# Patient Record
Sex: Male | Born: 1975
Health system: Southern US, Community
[De-identification: ages and names within clinical notes are randomized; demographics above are authoritative.]

## PROBLEM LIST (undated history)

## (undated) DIAGNOSIS — C9100 Acute lymphoblastic leukemia not having achieved remission: Secondary | ICD-10-CM

## (undated) DIAGNOSIS — K859 Acute pancreatitis without necrosis or infection, unspecified: Secondary | ICD-10-CM

## (undated) HISTORY — PX: CHEST SURGERY: SHX595

---

## 1997-05-24 ENCOUNTER — Inpatient Hospital Stay (HOSPITAL_COMMUNITY): Admission: AD | Admit: 1997-05-24 | Discharge: 1997-05-24 | Payer: Self-pay | Admitting: *Deleted

## 1999-10-02 ENCOUNTER — Emergency Department (HOSPITAL_COMMUNITY): Admission: EM | Admit: 1999-10-02 | Discharge: 1999-10-02 | Payer: Self-pay | Admitting: Emergency Medicine

## 2000-06-29 ENCOUNTER — Emergency Department (HOSPITAL_COMMUNITY): Admission: EM | Admit: 2000-06-29 | Discharge: 2000-06-29 | Payer: Self-pay | Admitting: Emergency Medicine

## 2000-06-29 ENCOUNTER — Encounter: Payer: Self-pay | Admitting: Emergency Medicine

## 2000-11-12 ENCOUNTER — Emergency Department (HOSPITAL_COMMUNITY): Admission: EM | Admit: 2000-11-12 | Discharge: 2000-11-12 | Payer: Self-pay | Admitting: Emergency Medicine

## 2002-07-08 ENCOUNTER — Encounter: Payer: Self-pay | Admitting: Emergency Medicine

## 2002-07-08 ENCOUNTER — Inpatient Hospital Stay (HOSPITAL_COMMUNITY): Admission: EM | Admit: 2002-07-08 | Discharge: 2002-07-10 | Payer: Self-pay | Admitting: Emergency Medicine

## 2009-06-01 ENCOUNTER — Emergency Department (HOSPITAL_COMMUNITY): Admission: EM | Admit: 2009-06-01 | Discharge: 2009-06-01 | Payer: Self-pay | Admitting: Emergency Medicine

## 2009-06-04 ENCOUNTER — Inpatient Hospital Stay (HOSPITAL_COMMUNITY): Admission: EM | Admit: 2009-06-04 | Discharge: 2009-06-05 | Payer: Self-pay | Admitting: Emergency Medicine

## 2010-06-11 LAB — POCT I-STAT, CHEM 8
Calcium, Ion: 1.05 mmol/L — ABNORMAL LOW (ref 1.12–1.32)
Chloride: 101 mEq/L (ref 96–112)
Glucose, Bld: 112 mg/dL — ABNORMAL HIGH (ref 70–99)
HCT: 47 % (ref 39.0–52.0)
Hemoglobin: 16 g/dL (ref 13.0–17.0)
TCO2: 27 mmol/L (ref 0–100)

## 2010-06-11 LAB — CSF CELL COUNT WITH DIFFERENTIAL
Lymphs, CSF: 60 % (ref 40–80)
Lymphs, CSF: 89 % — ABNORMAL HIGH (ref 40–80)
Monocyte-Macrophage-Spinal Fluid: 34 % (ref 15–45)
Monocyte-Macrophage-Spinal Fluid: 8 % — ABNORMAL LOW (ref 15–45)
RBC Count, CSF: 36 /mm3 — ABNORMAL HIGH
Segmented Neutrophils-CSF: 6 % (ref 0–6)
Tube #: 1
Tube #: 4
WBC, CSF: 1145 /mm3 (ref 0–5)

## 2010-06-11 LAB — DIFFERENTIAL
Basophils Absolute: 0 10*3/uL (ref 0.0–0.1)
Basophils Relative: 0 % (ref 0–1)
Basophils Relative: 1 % (ref 0–1)
Eosinophils Absolute: 0 10*3/uL (ref 0.0–0.7)
Eosinophils Absolute: 0 10*3/uL (ref 0.0–0.7)
Eosinophils Relative: 0 % (ref 0–5)
Eosinophils Relative: 1 % (ref 0–5)
Lymphocytes Relative: 18 % (ref 12–46)
Lymphs Abs: 1 10*3/uL (ref 0.7–4.0)
Monocytes Absolute: 0.6 10*3/uL (ref 0.1–1.0)
Monocytes Absolute: 0.6 10*3/uL (ref 0.1–1.0)
Monocytes Relative: 11 % (ref 3–12)
Monocytes Relative: 12 % (ref 3–12)
Neutro Abs: 3.6 10*3/uL (ref 1.7–7.7)
Neutrophils Relative %: 70 % (ref 43–77)

## 2010-06-11 LAB — CBC
HCT: 46.7 % (ref 39.0–52.0)
Hemoglobin: 15.1 g/dL (ref 13.0–17.0)
Hemoglobin: 15.7 g/dL (ref 13.0–17.0)
MCHC: 33.7 g/dL (ref 30.0–36.0)
MCV: 85.5 fL (ref 78.0–100.0)
Platelets: 166 10*3/uL (ref 150–400)
Platelets: 211 10*3/uL (ref 150–400)
RBC: 5.45 MIL/uL (ref 4.22–5.81)
RDW: 13.5 % (ref 11.5–15.5)
RDW: 13.5 % (ref 11.5–15.5)
WBC: 4.7 10*3/uL (ref 4.0–10.5)
WBC: 5.2 10*3/uL (ref 4.0–10.5)

## 2010-06-11 LAB — RAPID STREP SCREEN (MED CTR MEBANE ONLY): Streptococcus, Group A Screen (Direct): NEGATIVE

## 2010-06-11 LAB — GRAM STAIN: Gram Stain: NONE SEEN

## 2010-06-11 LAB — COMPREHENSIVE METABOLIC PANEL
ALT: 21 U/L (ref 0–53)
Albumin: 3 g/dL — ABNORMAL LOW (ref 3.5–5.2)
Alkaline Phosphatase: 51 U/L (ref 39–117)
Chloride: 99 mEq/L (ref 96–112)
Potassium: 3.9 mEq/L (ref 3.5–5.1)
Sodium: 139 mEq/L (ref 135–145)
Total Bilirubin: 0.6 mg/dL (ref 0.3–1.2)
Total Protein: 6.4 g/dL (ref 6.0–8.3)

## 2010-06-11 LAB — CSF CULTURE W GRAM STAIN: Culture: NO GROWTH

## 2010-06-11 LAB — PATHOLOGIST SMEAR REVIEW
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
## 2013-04-06 ENCOUNTER — Emergency Department (HOSPITAL_COMMUNITY): Payer: Self-pay

## 2013-04-06 ENCOUNTER — Other Ambulatory Visit: Payer: Self-pay

## 2013-04-06 ENCOUNTER — Inpatient Hospital Stay (HOSPITAL_COMMUNITY)
Admission: EM | Admit: 2013-04-06 | Discharge: 2013-04-09 | DRG: 200 | Disposition: A | Discharge status: Home / Self Care | Payer: Self-pay | Attending: General Surgery | Admitting: General Surgery

## 2013-04-06 ENCOUNTER — Encounter (HOSPITAL_COMMUNITY): Payer: Self-pay | Admitting: Emergency Medicine

## 2013-04-06 DIAGNOSIS — J942 Hemothorax: Secondary | ICD-10-CM

## 2013-04-06 DIAGNOSIS — S31809A Unspecified open wound of unspecified buttock, initial encounter: Secondary | ICD-10-CM | POA: Diagnosis present

## 2013-04-06 DIAGNOSIS — S21219A Laceration without foreign body of unspecified back wall of thorax without penetration into thoracic cavity, initial encounter: Secondary | ICD-10-CM | POA: Diagnosis present

## 2013-04-06 DIAGNOSIS — S271XXA Traumatic hemothorax, initial encounter: Principal | ICD-10-CM | POA: Diagnosis present

## 2013-04-06 DIAGNOSIS — S21209A Unspecified open wound of unspecified back wall of thorax without penetration into thoracic cavity, initial encounter: Secondary | ICD-10-CM

## 2013-04-06 DIAGNOSIS — F1020 Alcohol dependence, uncomplicated: Secondary | ICD-10-CM | POA: Diagnosis present

## 2013-04-06 DIAGNOSIS — R0902 Hypoxemia: Secondary | ICD-10-CM | POA: Diagnosis present

## 2013-04-06 DIAGNOSIS — F1210 Cannabis abuse, uncomplicated: Secondary | ICD-10-CM | POA: Diagnosis present

## 2013-04-06 DIAGNOSIS — F17200 Nicotine dependence, unspecified, uncomplicated: Secondary | ICD-10-CM | POA: Diagnosis present

## 2013-04-06 DIAGNOSIS — F10939 Alcohol use, unspecified with withdrawal, unspecified: Secondary | ICD-10-CM | POA: Diagnosis present

## 2013-04-06 DIAGNOSIS — Z880 Allergy status to penicillin: Secondary | ICD-10-CM

## 2013-04-06 DIAGNOSIS — D62 Acute posthemorrhagic anemia: Secondary | ICD-10-CM | POA: Diagnosis not present

## 2013-04-06 DIAGNOSIS — F10239 Alcohol dependence with withdrawal, unspecified: Secondary | ICD-10-CM | POA: Diagnosis present

## 2013-04-06 DIAGNOSIS — S2190XA Unspecified open wound of unspecified part of thorax, initial encounter: Principal | ICD-10-CM | POA: Diagnosis present

## 2013-04-06 DIAGNOSIS — I959 Hypotension, unspecified: Secondary | ICD-10-CM | POA: Diagnosis present

## 2013-04-06 DIAGNOSIS — Z856 Personal history of leukemia: Secondary | ICD-10-CM

## 2013-04-06 DIAGNOSIS — S31801A Laceration without foreign body of unspecified buttock, initial encounter: Secondary | ICD-10-CM | POA: Diagnosis present

## 2013-04-06 HISTORY — DX: Acute lymphoblastic leukemia not having achieved remission: C91.00

## 2013-04-06 LAB — CBC
HCT: 41.5 % (ref 39.0–52.0)
HEMATOCRIT: 38 % — AB (ref 39.0–52.0)
Hemoglobin: 14.6 g/dL (ref 13.0–17.0)
Hemoglobin: 13.2 g/dL (ref 13.0–17.0)
MCH: 28.7 pg (ref 26.0–34.0)
MCH: 28.3 pg (ref 26.0–34.0)
MCHC: 35.2 g/dL (ref 30.0–36.0)
MCHC: 34.7 g/dL (ref 30.0–36.0)
MCV: 81.5 fL (ref 78.0–100.0)
MCV: 81.4 fL (ref 78.0–100.0)
PLATELETS: 182 10*3/uL (ref 150–400)
Platelets: 177 10*3/uL (ref 150–400)
RBC: 5.09 MIL/uL (ref 4.22–5.81)
RBC: 4.67 MIL/uL (ref 4.22–5.81)
RDW: 14.5 % (ref 11.5–15.5)
RDW: 14.6 % (ref 11.5–15.5)
WBC: 4.2 10*3/uL (ref 4.0–10.5)
WBC: 7.8 10*3/uL (ref 4.0–10.5)

## 2013-04-06 LAB — POCT I-STAT, CHEM 8
BUN: <3 mg/dL — ABNORMAL LOW (ref 6–23)
CALCIUM ION: 1.07 mmol/L — AB (ref 1.12–1.23)
Chloride: 101 mEq/L (ref 96–112)
Creatinine, Ser: 1.5 mg/dL — ABNORMAL HIGH (ref 0.50–1.35)
GLUCOSE: 103 mg/dL — AB (ref 70–99)
HCT: 46 % (ref 39.0–52.0)
Hemoglobin: 15.6 g/dL (ref 13.0–17.0)
Potassium: 3.6 mEq/L — ABNORMAL LOW (ref 3.7–5.3)
SODIUM: 139 meq/L (ref 137–147)
TCO2: 25 mmol/L (ref 0–100)

## 2013-04-06 LAB — PROTIME-INR
INR: 1.07 (ref 0.00–1.49)
PROTHROMBIN TIME: 13.7 s (ref 11.6–15.2)

## 2013-04-06 LAB — COMPREHENSIVE METABOLIC PANEL
ALBUMIN: 3.7 g/dL (ref 3.5–5.2)
ALK PHOS: 88 U/L (ref 39–117)
ALT: 32 U/L (ref 0–53)
AST: 29 U/L (ref 0–37)
BILIRUBIN TOTAL: 0.4 mg/dL (ref 0.3–1.2)
BUN: 5 mg/dL — AB (ref 6–23)
CHLORIDE: 101 meq/L (ref 96–112)
CO2: 23 meq/L (ref 19–32)
Calcium: 8.2 mg/dL — ABNORMAL LOW (ref 8.4–10.5)
Creatinine, Ser: 1.26 mg/dL (ref 0.50–1.35)
GFR calc Af Amer: 83 mL/min — ABNORMAL LOW (ref >90)
GFR, EST NON AFRICAN AMERICAN: 72 mL/min — AB (ref >90)
GLUCOSE: 104 mg/dL — AB (ref 70–99)
POTASSIUM: 3.7 meq/L (ref 3.7–5.3)
Sodium: 141 mEq/L (ref 137–147)
Total Protein: 6.6 g/dL (ref 6.0–8.3)

## 2013-04-06 LAB — CG4 I-STAT (LACTIC ACID): Lactic Acid, Venous: 3.73 mmol/L — ABNORMAL HIGH (ref 0.5–2.2)

## 2013-04-06 LAB — MRSA PCR SCREENING: MRSA BY PCR: NEGATIVE

## 2013-04-06 LAB — ABO/RH: ABO/RH(D): AB POS

## 2013-04-06 MED ORDER — IOHEXOL 300 MG/ML  SOLN
100.0000 mL | Freq: Once | INTRAMUSCULAR | Status: AC | PRN
  Administered 2013-04-06: 100 mL via INTRAVENOUS

## 2013-04-06 MED ORDER — PANTOPRAZOLE SODIUM 40 MG IV SOLR
40.0000 mg | Freq: Every day | INTRAVENOUS | Status: DC
  Administered 2013-04-06: 40 mg via INTRAVENOUS
  Filled 2013-04-06 (×3): qty 40

## 2013-04-06 MED ORDER — FENTANYL CITRATE 0.05 MG/ML IJ SOLN
INTRAMUSCULAR | Status: AC
  Filled 2013-04-06: qty 2

## 2013-04-06 MED ORDER — MIDAZOLAM HCL 2 MG/2ML IJ SOLN
INTRAMUSCULAR | Status: AC
  Filled 2013-04-06: qty 2

## 2013-04-06 MED ORDER — HYDROMORPHONE HCL PF 1 MG/ML IJ SOLN
1.0000 mg | INTRAMUSCULAR | Status: DC | PRN
  Administered 2013-04-06 – 2013-04-08 (×10): 1 mg via INTRAVENOUS
  Filled 2013-04-06 (×10): qty 1

## 2013-04-06 MED ORDER — ONDANSETRON HCL 4 MG/2ML IJ SOLN: 4.0000 mg | Freq: Four times a day (QID) | INTRAMUSCULAR | Status: DC | PRN

## 2013-04-06 MED ORDER — SODIUM CHLORIDE 0.9 % IV BOLUS (SEPSIS)
1000.0000 mL | Freq: Once | INTRAVENOUS | Status: AC
  Administered 2013-04-06: 1000 mL via INTRAVENOUS

## 2013-04-06 MED ORDER — SODIUM CHLORIDE 0.9 % IV SOLN
INTRAVENOUS | Status: AC | PRN
  Administered 2013-04-06: 999 mL/h via INTRAVENOUS
  Administered 2013-04-06: 125 mL/h via INTRAVENOUS

## 2013-04-06 MED ORDER — BACITRACIN ZINC 500 UNIT/GM EX OINT
TOPICAL_OINTMENT | Freq: Two times a day (BID) | CUTANEOUS | Status: DC
  Administered 2013-04-06: 22:00:00 via TOPICAL
  Administered 2013-04-07: 15.5556 via TOPICAL
  Administered 2013-04-07: 10:00:00 via TOPICAL
  Administered 2013-04-08 – 2013-04-09 (×3): 15.5556 via TOPICAL
  Filled 2013-04-06 (×2): qty 15
  Filled 2013-04-06: qty 28.35
  Filled 2013-04-06: qty 15

## 2013-04-06 MED ORDER — DOCUSATE SODIUM 100 MG PO CAPS
100.0000 mg | ORAL_CAPSULE | Freq: Two times a day (BID) | ORAL | Status: DC
  Administered 2013-04-06 – 2013-04-09 (×6): 100 mg via ORAL
  Filled 2013-04-06 (×7): qty 1

## 2013-04-06 MED ORDER — POTASSIUM CHLORIDE IN NACL 20-0.45 MEQ/L-% IV SOLN
INTRAVENOUS | Status: DC
  Administered 2013-04-06: 21:00:00 via INTRAVENOUS
  Filled 2013-04-06 (×2): qty 1000

## 2013-04-06 MED ORDER — PANTOPRAZOLE SODIUM 40 MG PO TBEC
40.0000 mg | DELAYED_RELEASE_TABLET | Freq: Every day | ORAL | Status: DC
Start: 2013-04-06 — End: 2013-04-09
  Administered 2013-04-07 – 2013-04-09 (×3): 40 mg via ORAL
  Filled 2013-04-06 (×3): qty 1

## 2013-04-06 MED ORDER — ONDANSETRON HCL 4 MG PO TABS
4.0000 mg | ORAL_TABLET | Freq: Four times a day (QID) | ORAL | Status: DC | PRN
  Administered 2013-04-08 – 2013-04-09 (×2): 4 mg via ORAL
  Filled 2013-04-06 (×2): qty 1

## 2013-04-06 MED ORDER — MORPHINE SULFATE 2 MG/ML IJ SOLN
2.0000 mg | INTRAMUSCULAR | Status: DC | PRN
  Administered 2013-04-06 (×2): 2 mg via INTRAVENOUS
  Filled 2013-04-06: qty 1

## 2013-04-06 MED ORDER — ENOXAPARIN SODIUM 40 MG/0.4ML ~~LOC~~ SOLN
40.0000 mg | SUBCUTANEOUS | Status: DC
Start: 2013-04-07 — End: 2013-04-09
  Administered 2013-04-07 – 2013-04-09 (×3): 40 mg via SUBCUTANEOUS
  Filled 2013-04-06 (×3): qty 0.4

## 2013-04-06 MED ORDER — MORPHINE SULFATE 2 MG/ML IJ SOLN
INTRAMUSCULAR | Status: AC
  Administered 2013-04-06: 2 mg via INTRAVENOUS
  Filled 2013-04-06: qty 1

## 2013-04-06 MED ORDER — MIDAZOLAM HCL 2 MG/2ML IJ SOLN
2.0000 mg | Freq: Once | INTRAMUSCULAR | Status: AC
  Administered 2013-04-06: 2 mg via INTRAVENOUS
  Filled 2013-04-06: qty 2

## 2013-04-06 MED ORDER — FENTANYL CITRATE 0.05 MG/ML IJ SOLN
100.0000 ug | Freq: Once | INTRAMUSCULAR | Status: AC
  Administered 2013-04-06: 100 ug via INTRAVENOUS
  Filled 2013-04-06: qty 2

## 2013-04-06 MED ORDER — HYDROCODONE-ACETAMINOPHEN 10-325 MG PO TABS
0.5000 | ORAL_TABLET | ORAL | Status: DC | PRN
  Administered 2013-04-07: 2 via ORAL
  Administered 2013-04-07: 1 via ORAL
  Administered 2013-04-08 – 2013-04-09 (×5): 2 via ORAL
  Filled 2013-04-06 (×4): qty 2
  Filled 2013-04-06: qty 1
  Filled 2013-04-06 (×2): qty 2

## 2013-04-06 MED ORDER — METHOCARBAMOL 500 MG PO TABS
1000.0000 mg | ORAL_TABLET | Freq: Four times a day (QID) | ORAL | Status: DC | PRN
  Administered 2013-04-06 – 2013-04-08 (×5): 1000 mg via ORAL
  Filled 2013-04-06 (×6): qty 2
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
## 2013-04-06 NOTE — ED Notes (Signed)
Family at beside. Family given emotional support. 
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
## 2013-04-06 NOTE — ED Notes (Signed)
Pt returned from CT, verbal orders for medications for chest tube insertion at bedside.
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
## 2013-04-06 NOTE — ED Notes (Signed)
Pt transported to CT, RN, NT, MD with patient.
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
## 2013-04-06 NOTE — ED Notes (Signed)
[employer] at bedside, pt answering questions, pt able to talk in complete sentences but is complaining of SOB and pain. Pt has NRB on, and is keeping it in place.

## 2013-04-06 NOTE — ED Notes (Signed)
Pt placed on NRB, Stats increased to 100%.

## 2013-04-07 ENCOUNTER — Inpatient Hospital Stay (HOSPITAL_COMMUNITY): Payer: Self-pay

## 2013-04-07 LAB — CBC
HCT: 37.3 % — ABNORMAL LOW (ref 39.0–52.0)
Hemoglobin: 12.7 g/dL — ABNORMAL LOW (ref 13.0–17.0)
MCH: 28 pg (ref 26.0–34.0)
MCHC: 34 g/dL (ref 30.0–36.0)
MCV: 82.3 fL (ref 78.0–100.0)
Platelets: 161 10*3/uL (ref 150–400)
RBC: 4.53 MIL/uL (ref 4.22–5.81)
RDW: 14.7 % (ref 11.5–15.5)
WBC: 7.9 10*3/uL (ref 4.0–10.5)

## 2013-04-07 MED ORDER — LORAZEPAM 2 MG/ML IJ SOLN
1.0000 mg | Freq: Four times a day (QID) | INTRAMUSCULAR | Status: DC | PRN
  Administered 2013-04-08: 1 mg via INTRAVENOUS
  Filled 2013-04-07: qty 1

## 2013-04-07 MED ORDER — THIAMINE HCL 100 MG/ML IJ SOLN
100.0000 mg | Freq: Every day | INTRAMUSCULAR | Status: DC
  Filled 2013-04-07 (×2): qty 1

## 2013-04-07 MED ORDER — ADULT MULTIVITAMIN W/MINERALS CH
1.0000 | ORAL_TABLET | Freq: Every day | ORAL | Status: DC
  Administered 2013-04-07 – 2013-04-09 (×3): 1 via ORAL
  Filled 2013-04-07 (×3): qty 1

## 2013-04-07 MED ORDER — LORAZEPAM 2 MG/ML IJ SOLN
INTRAMUSCULAR | Status: AC
  Administered 2013-04-07: 2 mg
  Filled 2013-04-07: qty 1

## 2013-04-07 MED ORDER — ENSURE COMPLETE PO LIQD
237.0000 mL | ORAL | Status: DC
  Administered 2013-04-08: 237 mL via ORAL

## 2013-04-07 MED ORDER — ENSURE COMPLETE PO LIQD
237.0000 mL | Freq: Once | ORAL | Status: AC
Start: 2013-04-07 — End: 2013-04-07
  Administered 2013-04-07: 237 mL via ORAL

## 2013-04-07 MED ORDER — LORAZEPAM 1 MG PO TABS: 1.0000 mg | ORAL_TABLET | Freq: Four times a day (QID) | ORAL | Status: DC | PRN

## 2013-04-07 MED ORDER — FOLIC ACID 1 MG PO TABS
1.0000 mg | ORAL_TABLET | Freq: Every day | ORAL | Status: DC
  Administered 2013-04-07 – 2013-04-09 (×3): 1 mg via ORAL
  Filled 2013-04-07 (×3): qty 1

## 2013-04-07 MED ORDER — VITAMIN B-1 100 MG PO TABS
100.0000 mg | ORAL_TABLET | Freq: Every day | ORAL | Status: DC
  Administered 2013-04-07 – 2013-04-09 (×3): 100 mg via ORAL
  Filled 2013-04-07 (×3): qty 1

## 2013-04-07 MED ORDER — LORAZEPAM 1 MG PO TABS
0.0000 mg | ORAL_TABLET | Freq: Two times a day (BID) | ORAL | Status: DC
  Administered 2013-04-09: 1 mg via ORAL
  Filled 2013-04-07: qty 1

## 2013-04-07 MED ORDER — LORAZEPAM 1 MG PO TABS
0.0000 mg | ORAL_TABLET | Freq: Four times a day (QID) | ORAL | Status: AC
  Administered 2013-04-07 – 2013-04-09 (×6): 1 mg via ORAL
  Filled 2013-04-07 (×6): qty 1

## 2013-04-07 MED ORDER — SPIRITUS FRUMENTI
2.0000 | Freq: Four times a day (QID) | ORAL | Status: DC
  Administered 2013-04-07 – 2013-04-08 (×4): 2 via ORAL
  Filled 2013-04-07 (×14): qty 2

## 2013-04-07 NOTE — Progress Notes (Signed)
UR completed.  Jonee Lamore, RN BSN MHA CCM Trauma/Neuro ICU Case Manager [phone]  
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
## 2013-04-07 NOTE — Progress Notes (Signed)
Cannot override po ativan from pyxis. First dose needed stat. Over ride for 2mg  ativan IV.

## 2013-04-07 NOTE — Progress Notes (Signed)
Pt sweating, tremorous. He is itching all over. He states he is "an alcoholic". MD paged for  Orders.
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
## 2013-04-07 NOTE — Progress Notes (Signed)
Received from 43M to room 6n05, oriented to room and surroundings, wife at bedside, denies nausea, c/o left back pain 5/10.

## 2013-04-08 ENCOUNTER — Inpatient Hospital Stay (HOSPITAL_COMMUNITY): Payer: Self-pay

## 2013-04-08 DIAGNOSIS — D62 Acute posthemorrhagic anemia: Secondary | ICD-10-CM

## 2013-04-08 LAB — TYPE AND SCREEN
ABO/RH(D): AB POS
Antibody Screen: NEGATIVE
Unit division: 0
Unit division: 0

## 2013-04-08 MED ORDER — NICOTINE 21 MG/24HR TD PT24
21.0000 mg | MEDICATED_PATCH | Freq: Every day | TRANSDERMAL | Status: DC
  Administered 2013-04-08 – 2013-04-09 (×2): 21 mg via TRANSDERMAL
  Filled 2013-04-08 (×2): qty 1

## 2013-04-08 NOTE — Progress Notes (Signed)
No air leak.  Okay to go on waterseal and discontinue tomorrow if the lung remains expanded.  This patient has been seen and I agree with the findings and treatment plan.  Kathryne Eriksson. Dahlia Bailiff, MD, [place] [phone] (pager) [phone] (direct pager) Trauma Surgeon
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
## 2013-04-09 ENCOUNTER — Inpatient Hospital Stay (HOSPITAL_COMMUNITY): Payer: MEDICAID

## 2013-04-09 ENCOUNTER — Inpatient Hospital Stay (HOSPITAL_COMMUNITY): Payer: Self-pay

## 2013-04-09 MED ORDER — BISACODYL 10 MG RE SUPP: 10.0000 mg | Freq: Once | RECTAL | Status: DC

## 2013-04-09 MED ORDER — METHOCARBAMOL 500 MG PO TABS: 1000.0000 mg | ORAL_TABLET | Freq: Four times a day (QID) | ORAL | Status: DC | PRN

## 2013-04-09 MED ORDER — DSS 100 MG PO CAPS: 100.0000 mg | ORAL_CAPSULE | Freq: Two times a day (BID) | ORAL | Status: DC

## 2013-04-09 MED ORDER — TRAMADOL HCL 50 MG PO TABS: 50.0000 mg | ORAL_TABLET | Freq: Four times a day (QID) | ORAL | Status: DC | PRN

## 2013-04-09 MED ORDER — THIAMINE HCL 100 MG PO TABS: 100.0000 mg | ORAL_TABLET | Freq: Every day | ORAL | Status: DC

## 2013-04-09 MED ORDER — HYDROCODONE-ACETAMINOPHEN 10-325 MG PO TABS: 0.5000 | ORAL_TABLET | ORAL | Status: DC | PRN

## 2013-04-09 MED ORDER — ADULT MULTIVITAMIN W/MINERALS CH: 1.0000 | ORAL_TABLET | Freq: Every day | ORAL | Status: DC

## 2013-04-09 MED ORDER — FOLIC ACID 1 MG PO TABS: 1.0000 mg | ORAL_TABLET | Freq: Every day | ORAL | Status: DC

## 2013-04-09 MED ORDER — BACITRACIN ZINC 500 UNIT/GM EX OINT: TOPICAL_OINTMENT | Freq: Two times a day (BID) | CUTANEOUS | Status: DC
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
## 2013-04-09 NOTE — Progress Notes (Signed)
DC instructions reviewed with patient and wife, questions answered, verbalized understanding.   Prescriptions given to patient.  Patient transported to front of hospital via wheelchair accompanied by nurse tech and family. Patient in good condition upon leaving [facility].

## 2013-04-09 NOTE — Clinical Social Work Note (Signed)
Clinical Social Worker continuing to follow patient for support and discharge planning needs.  CSW met with patient at bedside who had his wife and child with him at bedside stating "they came to pick me up and take me home to [place]."  Patient confirmed that she would be providing patient with transportation home at discharge.  CSW contacted PA who states patient is able to discharge this evening - RN notified.  Patient states that he will locate his own substance abuse resources when he returns to [place].   Clinical Social Worker will sign off for now as social work intervention is no longer needed. Please consult [place] again if new need arises.  Barbette Or, [place]
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
## 2013-04-09 NOTE — Discharge Instructions (Signed)
Hemothorax A hemothorax is a collection of blood in the space between the chest wall and the lung. The medical term for this space is the pleural cavity. It is also called the pleural space. The most common cause for this condition is a chest injury. It can also happen from:  Diseases in the chest.  Blood clotting problems.  Taking blood thinning medicine.  Cancer of the chest.  Lung and heart surgeries. Mild cases of hemothorax may clear without treatment in a couple weeks. More severe hemothorax may require surgical treatment. Because the blood compresses the lung and takes up space, some of the symptoms from this are:  Rapid, difficult breathing and shortness of breath.  Rapid heart rate, anxiety, and restlessness.  The blood pressure may be low and not high enough to support life. Sometimes with a hemothorax there is also pneumothorax. This means air in the chest. The air is not in the lung but is located outside the lung between the lung and the chest wall. When this happens it can cause added problems because the necessary lung space is taken up by something that is preventing the lungs from working. DIAGNOSIS  Your caregiver can usually tell what is wrong by examination and a chest X-ray. TREATMENT   If a hemothorax is mild, it may be watched to see if it will get better without treatment.  A more severe case may require having a tube put in to drain the pleural space of the lung. This is called a thoracostomy. It may also be called a chest tube or chest drain.  If bleeding continues, surgery may be required to go into the chest to stop the bleeding. Opening the chest is called a thoracotomy. Document Released: 11/29/2003 Document Revised: 05/27/2011 Document Reviewed: 12/23/2007 [facility] Patient Information 2014 [place], [place].  Dressing Change A dressing is a material placed over wounds. It keeps the wound clean, dry, and protected from further injury. This provides an  environment that favors wound healing. START CHANGING YOUR DRESSING AFTER 48-72 HOURS.  OKAY TO SHOWER ONCE THERE IS A SCAB. BEFORE YOU BEGIN  Get your supplies together. Things you may need include:  Saline solution.  Flexible gauze dressing.  Medicated cream.  Tape.  Gloves.  Abdominal dressing pads.  Gauze squares.  Plastic bags.  Take pain medicine 30 minutes before the dressing change if you need it.  Take a shower before you do the first dressing change of the day. Use plastic wrap or a plastic bag to prevent the dressing from getting wet. REMOVING YOUR OLD DRESSING   Wash your hands with soap and water. Dry your hands with a clean towel.  Put on your gloves.  Remove any tape.  Carefully remove the old dressing. If the dressing sticks, you may dampen it with warm water to loosen it, or follow your caregiver's specific directions.  Remove any gauze or packing tape that is in your wound.  Take off your gloves.  Put the gloves, tape, gauze, or any packing tape into a plastic bag. CHANGING YOUR DRESSING  Open the supplies.  Take the cap off the saline solution.  Open the gauze package so that the gauze remains on the inside of the package.  Put on your gloves.  Clean your wound as told by your caregiver.  If you have been told to keep your wound dry, follow those instructions.  Your caregiver may tell you to do one or more of the following:  Pick up the  gauze. Pour the saline solution over the gauze. Squeeze out the extra saline solution.  Put medicated cream or other medicine on your wound if you have been told to do so.  Put the solution soaked gauze only in your wound, not on the skin around it.  Pack your wound loosely or as told by your caregiver.  Put dry gauze on your wound.  Put abdominal dressing pads over the dry gauze if your wet gauze soaks through.  Tape the abdominal dressing pads in place so they will not fall off. Do not wrap the  tape completely around the affected part (arm, leg, abdomen).  Wrap the dressing pads with a flexible gauze dressing to secure it in place.  Take off your gloves. Put them in the plastic bag with the old dressing. Tie the bag shut and throw it away.  Keep the dressing clean and dry until your next dressing change.  Wash your hands. SEEK MEDICAL CARE IF:  Your skin around the wound looks red.  Your wound feels more tender or sore.  You see pus in the wound.  Your wound smells bad.  You have a fever.  Your skin around the wound has a rash that itches and burns.  You see black or yellow skin in your wound that was not there before.  You feel nauseous, throw up, and feel very tired. Document Released: 04/11/2004 Document Revised: 05/27/2011 Document Reviewed: 01/14/2011 [facility] Patient Information 2014 [place], [place].

## 2013-04-09 NOTE — Progress Notes (Signed)
Agree Patient examined and I agree with the assessment and plan  Georganna Skeans, MD, MPH, FACS Pager: [phone]  04/09/2013 2:53 PM
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
## 2013-04-12 NOTE — Discharge Summary (Signed)
Amiyrah Lamere, MD, MPH, FACS Pager: [phone]  

## 2013-04-21 ENCOUNTER — Encounter (INDEPENDENT_AMBULATORY_CARE_PROVIDER_SITE_OTHER): Payer: Self-pay

## 2013-04-26 ENCOUNTER — Encounter (INDEPENDENT_AMBULATORY_CARE_PROVIDER_SITE_OTHER): Payer: Self-pay

## 2017-12-09 ENCOUNTER — Other Ambulatory Visit: Payer: Self-pay

## 2017-12-09 ENCOUNTER — Encounter (HOSPITAL_COMMUNITY): Payer: Self-pay | Admitting: Emergency Medicine

## 2017-12-09 DIAGNOSIS — S2231XA Fracture of one rib, right side, initial encounter for closed fracture: Secondary | ICD-10-CM | POA: Insufficient documentation

## 2017-12-09 DIAGNOSIS — S27321A Contusion of lung, unilateral, initial encounter: Secondary | ICD-10-CM | POA: Insufficient documentation

## 2017-12-09 DIAGNOSIS — F17200 Nicotine dependence, unspecified, uncomplicated: Secondary | ICD-10-CM | POA: Insufficient documentation

## 2017-12-09 DIAGNOSIS — R55 Syncope and collapse: Secondary | ICD-10-CM | POA: Insufficient documentation

## 2017-12-09 DIAGNOSIS — Y929 Unspecified place or not applicable: Secondary | ICD-10-CM | POA: Insufficient documentation

## 2017-12-09 DIAGNOSIS — Y939 Activity, unspecified: Secondary | ICD-10-CM | POA: Insufficient documentation

## 2017-12-09 DIAGNOSIS — S60512A Abrasion of left hand, initial encounter: Secondary | ICD-10-CM | POA: Insufficient documentation

## 2017-12-09 DIAGNOSIS — S0101XA Laceration without foreign body of scalp, initial encounter: Secondary | ICD-10-CM | POA: Insufficient documentation

## 2017-12-09 DIAGNOSIS — S62512A Displaced fracture of proximal phalanx of left thumb, initial encounter for closed fracture: Secondary | ICD-10-CM | POA: Insufficient documentation

## 2017-12-09 DIAGNOSIS — Y999 Unspecified external cause status: Secondary | ICD-10-CM | POA: Insufficient documentation
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
## 2017-12-10 ENCOUNTER — Emergency Department (HOSPITAL_COMMUNITY): Payer: Self-pay

## 2017-12-10 ENCOUNTER — Emergency Department (HOSPITAL_COMMUNITY)
Admission: EM | Admit: 2017-12-10 | Discharge: 2017-12-10 | Disposition: A | Discharge status: Home / Self Care | Payer: Self-pay | Attending: Emergency Medicine | Admitting: Emergency Medicine

## 2017-12-10 DIAGNOSIS — S2231XA Fracture of one rib, right side, initial encounter for closed fracture: Secondary | ICD-10-CM

## 2017-12-10 DIAGNOSIS — S020XXA Fracture of vault of skull, initial encounter for closed fracture: Secondary | ICD-10-CM

## 2017-12-10 DIAGNOSIS — S0101XA Laceration without foreign body of scalp, initial encounter: Secondary | ICD-10-CM

## 2017-12-10 DIAGNOSIS — S62512A Displaced fracture of proximal phalanx of left thumb, initial encounter for closed fracture: Secondary | ICD-10-CM

## 2017-12-10 DIAGNOSIS — S27321A Contusion of lung, unilateral, initial encounter: Secondary | ICD-10-CM

## 2017-12-10 MED ORDER — BACITRACIN ZINC 500 UNIT/GM EX OINT
TOPICAL_OINTMENT | Freq: Two times a day (BID) | CUTANEOUS | Status: DC
  Administered 2017-12-10: 1 via TOPICAL
  Filled 2017-12-10: qty 1.8

## 2017-12-10 MED ORDER — LIDOCAINE HCL 2 % IJ SOLN
5.0000 mL | Freq: Once | INTRAMUSCULAR | Status: AC
  Administered 2017-12-10: 100 mg via INTRADERMAL
  Filled 2017-12-10: qty 20

## 2017-12-10 MED ORDER — IBUPROFEN 800 MG PO TABS
800.0000 mg | ORAL_TABLET | Freq: Once | ORAL | Status: AC
  Administered 2017-12-10: 800 mg via ORAL
  Filled 2017-12-10: qty 1

## 2017-12-10 MED ORDER — IBUPROFEN 400 MG PO TABS: 400.0000 mg | ORAL_TABLET | Freq: Four times a day (QID) | ORAL | 0 refills | Status: AC | PRN

## 2017-12-10 MED ORDER — MORPHINE SULFATE (PF) 4 MG/ML IV SOLN
4.0000 mg | Freq: Once | INTRAVENOUS | Status: AC
  Administered 2017-12-10: 4 mg via INTRAVENOUS
  Filled 2017-12-10: qty 1

## 2017-12-10 MED ORDER — SODIUM CHLORIDE 0.9 % IV BOLUS
1000.0000 mL | Freq: Once | INTRAVENOUS | Status: AC
  Administered 2017-12-10: 1000 mL via INTRAVENOUS

## 2017-12-10 MED ORDER — CEPHALEXIN 500 MG PO CAPS: 500.0000 mg | ORAL_CAPSULE | Freq: Three times a day (TID) | ORAL | 0 refills | Status: AC

## 2017-12-10 MED ORDER — ACETAMINOPHEN 500 MG PO TABS: 1000.0000 mg | ORAL_TABLET | Freq: Four times a day (QID) | ORAL | 0 refills | Status: AC | PRN

## 2017-12-10 MED ORDER — OXYCODONE HCL 5 MG PO TABS: 5.0000 mg | ORAL_TABLET | ORAL | 0 refills | Status: AC | PRN

## 2017-12-10 MED ORDER — CEPHALEXIN 500 MG PO CAPS: 500.0000 mg | ORAL_CAPSULE | Freq: Three times a day (TID) | ORAL | 0 refills | Status: DC

## 2017-12-10 NOTE — ED Notes (Signed)
RX X 3 GIVEN. IS DEVICE GIVEN. PT UNDERSTANDS HOW TO USE

## 2017-12-10 NOTE — ED Notes (Signed)
Pt is A&O x4. Pt is able to follow commands. Pt is c/o pain 10/10. EDP will be notified.

## 2017-12-10 NOTE — ED Notes (Signed)
Pt is A&Ox4. Pt is able to follow commands.

## 2017-12-10 NOTE — ED Notes (Signed)
Patient transported to CT 

## 2017-12-10 NOTE — ED Notes (Addendum)
ED Provider at bedside. [facility]LOS[facility]AN 

## 2017-12-10 NOTE — ED Notes (Signed)
Per Dr. Tomi Bamberger, pain medication is not warranted at this time due to patient unwilling to arouse for evaluation. EDP is concerned about head injury at this time.
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
## 2017-12-10 NOTE — Discharge Instructions (Signed)
Take Tylenol 1000 mg 4 times a day for 1 week. This is the maximum dose of Tylenol (acetaminophen) you take from all sources. Please check other over-the-counter medications and prescriptions to ensure you are not taking other medications that contain acetaminophen.  You may also take ibuprofen 400 mg 6 times a day alternating with or at the same time as tylenol.  Take oxycodone as needed for breakthrough pain.  This medication can be addicting, sedating and cause constipation.

## 2017-12-10 NOTE — ED Notes (Signed)
ED Provider at bedside. [place]

## 2018-07-08 ENCOUNTER — Encounter (HOSPITAL_COMMUNITY): Payer: Self-pay | Admitting: Emergency Medicine

## 2018-07-08 ENCOUNTER — Other Ambulatory Visit: Payer: Self-pay

## 2018-07-08 ENCOUNTER — Emergency Department (HOSPITAL_COMMUNITY)
Admission: EM | Admit: 2018-07-08 | Discharge: 2018-07-08 | Disposition: A | Discharge status: Home / Self Care | Payer: Self-pay | Attending: Emergency Medicine | Admitting: Emergency Medicine

## 2018-07-08 ENCOUNTER — Emergency Department (HOSPITAL_COMMUNITY): Payer: Self-pay

## 2018-07-08 DIAGNOSIS — M79672 Pain in left foot: Secondary | ICD-10-CM | POA: Insufficient documentation

## 2018-07-08 DIAGNOSIS — F17200 Nicotine dependence, unspecified, uncomplicated: Secondary | ICD-10-CM | POA: Insufficient documentation

## 2018-07-08 DIAGNOSIS — Z590 Homelessness: Secondary | ICD-10-CM | POA: Insufficient documentation

## 2018-07-08 LAB — CBC WITH DIFFERENTIAL/PLATELET
Abs Immature Granulocytes: 0.04 10*3/uL (ref 0.00–0.07)
Basophils Absolute: 0 10*3/uL (ref 0.0–0.1)
Basophils Relative: 0 %
Eosinophils Absolute: 0.1 10*3/uL (ref 0.0–0.5)
Eosinophils Relative: 1 %
HCT: 45.2 % (ref 39.0–52.0)
Hemoglobin: 15.5 g/dL (ref 13.0–17.0)
Immature Granulocytes: 1 %
Lymphocytes Relative: 10 %
Lymphs Abs: 0.7 10*3/uL (ref 0.7–4.0)
MCH: 28.7 pg (ref 26.0–34.0)
MCHC: 34.3 g/dL (ref 30.0–36.0)
MCV: 83.5 fL (ref 80.0–100.0)
Monocytes Absolute: 0.8 10*3/uL (ref 0.1–1.0)
Monocytes Relative: 11 %
Neutro Abs: 5.9 10*3/uL (ref 1.7–7.7)
Neutrophils Relative %: 77 %
Platelets: 170 10*3/uL (ref 150–400)
RBC: 5.41 MIL/uL (ref 4.22–5.81)
RDW: 12.8 % (ref 11.5–15.5)
WBC: 7.5 10*3/uL (ref 4.0–10.5)
nRBC: 0 % (ref 0.0–0.2)

## 2018-07-08 LAB — BASIC METABOLIC PANEL
Anion gap: 13 (ref 5–15)
BUN: 5 mg/dL — ABNORMAL LOW (ref 6–20)
CO2: 26 mmol/L (ref 22–32)
Calcium: 9.1 mg/dL (ref 8.9–10.3)
Chloride: 97 mmol/L — ABNORMAL LOW (ref 98–111)
Creatinine, Ser: 1.15 mg/dL (ref 0.61–1.24)
GFR calc Af Amer: >60 mL/min (ref >60)
GFR calc non Af Amer: >60 mL/min (ref >60)
Glucose, Bld: 99 mg/dL (ref 70–99)
Potassium: 3.4 mmol/L — ABNORMAL LOW (ref 3.5–5.1)
Sodium: 136 mmol/L (ref 135–145)

## 2018-07-08 MED ORDER — ACETAMINOPHEN 500 MG PO TABS
1000.0000 mg | ORAL_TABLET | Freq: Once | ORAL | Status: AC
  Administered 2018-07-08: 20:00:00 1000 mg via ORAL
  Filled 2018-07-08: qty 2

## 2018-07-08 MED ORDER — BACITRACIN ZINC 500 UNIT/GM EX OINT
TOPICAL_OINTMENT | Freq: Once | CUTANEOUS | Status: AC
  Administered 2018-07-08: 23:00:00 via TOPICAL
  Filled 2018-07-08: qty 1.8

## 2018-07-08 MED ORDER — CEPHALEXIN 500 MG PO CAPS: 500.0000 mg | ORAL_CAPSULE | Freq: Four times a day (QID) | ORAL | 0 refills | Status: AC

## 2018-07-08 MED ORDER — CEPHALEXIN 250 MG PO CAPS
500.0000 mg | ORAL_CAPSULE | Freq: Once | ORAL | Status: AC
  Administered 2018-07-08: 23:00:00 500 mg via ORAL
  Filled 2018-07-08: qty 2

## 2018-07-08 NOTE — ED Notes (Signed)
Patient transported to X-ray 
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
## 2018-07-08 NOTE — Discharge Instructions (Addendum)
You can take Tylenol or Ibuprofen as directed for pain. You can alternate Tylenol and Ibuprofen every 4 hours. If you take Tylenol at 1pm, then you can take Ibuprofen at 5pm. Then you can take Tylenol again at 9pm.   Take antibiotics as directed. Please take all of your antibiotics until finished.  It is important that you keep your feet clean and dry.  Make sure you are cleaning the wound.  Return to the emergency department for any fevers, worsening swelling that extends of the leg, worsening wound, any other worsening concerning symptoms.

## 2018-07-08 NOTE — ED Triage Notes (Signed)
Pt here for left foot pain and sore. Pt got his work boots wet and caused the sore on his foot

## 2018-07-08 NOTE — ED Notes (Signed)
Patient verbalizes understanding of discharge instructions. Opportunity for questioning and answers were provided. Armband removed by staff, pt discharged from ED ambulatory by self\  

## 2018-07-09 ENCOUNTER — Telehealth: Payer: Self-pay | Admitting: *Deleted

## 2018-07-09 NOTE — Telephone Encounter (Signed)
EDCM placed call regarding medication assistance consult.  Phone number on file is disconnected.

## 2020-02-26 ENCOUNTER — Emergency Department (HOSPITAL_COMMUNITY)
Admission: EM | Admit: 2020-02-26 | Discharge: 2020-02-27 | Disposition: A | Discharge status: Home / Self Care | Payer: 59 | Attending: Emergency Medicine | Admitting: Emergency Medicine

## 2020-02-26 ENCOUNTER — Other Ambulatory Visit: Payer: Self-pay

## 2020-02-26 ENCOUNTER — Encounter (HOSPITAL_COMMUNITY): Payer: Self-pay

## 2020-02-26 DIAGNOSIS — Z4800 Encounter for change or removal of nonsurgical wound dressing: Secondary | ICD-10-CM | POA: Insufficient documentation

## 2020-02-26 DIAGNOSIS — Z5189 Encounter for other specified aftercare: Secondary | ICD-10-CM

## 2020-02-26 DIAGNOSIS — F17200 Nicotine dependence, unspecified, uncomplicated: Secondary | ICD-10-CM | POA: Diagnosis not present

## 2020-02-26 DIAGNOSIS — T8131XA Disruption of external operation (surgical) wound, not elsewhere classified, initial encounter: Secondary | ICD-10-CM | POA: Diagnosis present
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
## 2020-02-27 ENCOUNTER — Encounter (HOSPITAL_COMMUNITY): Payer: Self-pay

## 2020-02-27 ENCOUNTER — Emergency Department (HOSPITAL_COMMUNITY): Payer: 59

## 2020-02-27 LAB — CBC WITH DIFFERENTIAL/PLATELET
Abs Immature Granulocytes: 0.03 10*3/uL (ref 0.00–0.07)
Basophils Absolute: 0 10*3/uL (ref 0.0–0.1)
Basophils Relative: 1 %
Eosinophils Absolute: 0.4 10*3/uL (ref 0.0–0.5)
Eosinophils Relative: 6 %
HCT: 37.3 % — ABNORMAL LOW (ref 39.0–52.0)
Hemoglobin: 12.4 g/dL — ABNORMAL LOW (ref 13.0–17.0)
Immature Granulocytes: 0 %
Lymphocytes Relative: 19 %
Lymphs Abs: 1.5 10*3/uL (ref 0.7–4.0)
MCH: 26.5 pg (ref 26.0–34.0)
MCHC: 33.2 g/dL (ref 30.0–36.0)
MCV: 79.7 fL — ABNORMAL LOW (ref 80.0–100.0)
Monocytes Absolute: 0.6 10*3/uL (ref 0.1–1.0)
Monocytes Relative: 8 %
Neutro Abs: 5.1 10*3/uL (ref 1.7–7.7)
Neutrophils Relative %: 66 %
Platelets: 201 10*3/uL (ref 150–400)
RBC: 4.68 MIL/uL (ref 4.22–5.81)
RDW: 20.4 % — ABNORMAL HIGH (ref 11.5–15.5)
WBC: 7.7 10*3/uL (ref 4.0–10.5)
nRBC: 0 % (ref 0.0–0.2)

## 2020-02-27 LAB — BASIC METABOLIC PANEL
Anion gap: 13 (ref 5–15)
BUN: 9 mg/dL (ref 6–20)
CO2: 27 mmol/L (ref 22–32)
Calcium: 8.2 mg/dL — ABNORMAL LOW (ref 8.9–10.3)
Chloride: 100 mmol/L (ref 98–111)
Creatinine, Ser: 1.36 mg/dL — ABNORMAL HIGH (ref 0.61–1.24)
GFR, Estimated: >60 mL/min (ref >60)
Glucose, Bld: 95 mg/dL (ref 70–99)
Potassium: 4 mmol/L (ref 3.5–5.1)
Sodium: 140 mmol/L (ref 135–145)

## 2020-02-27 LAB — LACTIC ACID, PLASMA: Lactic Acid, Venous: 1.6 mmol/L (ref 0.5–1.9)

## 2020-02-27 MED ORDER — DOXYCYCLINE HYCLATE 100 MG PO CAPS: 100.0000 mg | ORAL_CAPSULE | Freq: Two times a day (BID) | ORAL | 0 refills | Status: DC

## 2020-02-27 MED ORDER — DOXYCYCLINE HYCLATE 100 MG PO TABS
100.0000 mg | ORAL_TABLET | Freq: Once | ORAL | Status: AC
  Administered 2020-02-27: 100 mg via ORAL
  Filled 2020-02-27: qty 1

## 2020-02-27 MED ORDER — IOHEXOL 300 MG/ML  SOLN
100.0000 mL | Freq: Once | INTRAMUSCULAR | Status: AC | PRN
  Administered 2020-02-27: 100 mL via INTRAVENOUS
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
## 2020-02-27 NOTE — ED Notes (Signed)
Pt departs ED at this time. Calling ride. VSS. Understands DC instructions.

## 2020-02-27 NOTE — Discharge Instructions (Addendum)
The CT scan revealed no abscess near your surgical incision.  There is some evidence of cellulitis, which is infection of the skin.  Take antibiotics as directed.  Return to the ER if your symptoms worsen, otherwise, please follow-up in the surgery clinic.  There was an incidental finding of a small fluid collection near your liver, this is of uncertain age or etiology, but is not thought to be related to the symptoms you are experiencing tonight.

## 2020-04-09 ENCOUNTER — Encounter (HOSPITAL_COMMUNITY): Payer: Self-pay | Admitting: Emergency Medicine

## 2020-04-09 ENCOUNTER — Other Ambulatory Visit: Payer: Self-pay

## 2020-04-09 DIAGNOSIS — F17200 Nicotine dependence, unspecified, uncomplicated: Secondary | ICD-10-CM | POA: Insufficient documentation

## 2020-04-09 DIAGNOSIS — Z20822 Contact with and (suspected) exposure to covid-19: Secondary | ICD-10-CM | POA: Insufficient documentation

## 2020-04-09 DIAGNOSIS — R2243 Localized swelling, mass and lump, lower limb, bilateral: Secondary | ICD-10-CM | POA: Diagnosis not present

## 2020-04-09 LAB — COMPREHENSIVE METABOLIC PANEL
ALT: 10 U/L (ref 0–44)
AST: 23 U/L (ref 15–41)
Albumin: 3.4 g/dL — ABNORMAL LOW (ref 3.5–5.0)
Alkaline Phosphatase: 107 U/L (ref 38–126)
Anion gap: 10 (ref 5–15)
BUN: 8 mg/dL (ref 6–20)
CO2: 27 mmol/L (ref 22–32)
Calcium: 8.5 mg/dL — ABNORMAL LOW (ref 8.9–10.3)
Chloride: 100 mmol/L (ref 98–111)
Creatinine, Ser: 1.53 mg/dL — ABNORMAL HIGH (ref 0.61–1.24)
GFR, Estimated: 57 mL/min — ABNORMAL LOW (ref >60)
Glucose, Bld: 88 mg/dL (ref 70–99)
Potassium: 3.9 mmol/L (ref 3.5–5.1)
Sodium: 137 mmol/L (ref 135–145)
Total Bilirubin: 0.5 mg/dL (ref 0.3–1.2)
Total Protein: 7.5 g/dL (ref 6.5–8.1)

## 2020-04-09 LAB — CBC WITH DIFFERENTIAL/PLATELET
Abs Immature Granulocytes: 0.03 10*3/uL (ref 0.00–0.07)
Basophils Absolute: 0.1 10*3/uL (ref 0.0–0.1)
Basophils Relative: 1 %
Eosinophils Absolute: 0.4 10*3/uL (ref 0.0–0.5)
Eosinophils Relative: 6 %
HCT: 41.4 % (ref 39.0–52.0)
Hemoglobin: 13.5 g/dL (ref 13.0–17.0)
Immature Granulocytes: 0 %
Lymphocytes Relative: 18 %
Lymphs Abs: 1.4 10*3/uL (ref 0.7–4.0)
MCH: 27.8 pg (ref 26.0–34.0)
MCHC: 32.6 g/dL (ref 30.0–36.0)
MCV: 85.4 fL (ref 80.0–100.0)
Monocytes Absolute: 0.6 10*3/uL (ref 0.1–1.0)
Monocytes Relative: 9 %
Neutro Abs: 4.9 10*3/uL (ref 1.7–7.7)
Neutrophils Relative %: 66 %
Platelets: 270 10*3/uL (ref 150–400)
RBC: 4.85 MIL/uL (ref 4.22–5.81)
RDW: 18.1 % — ABNORMAL HIGH (ref 11.5–15.5)
WBC: 7.4 10*3/uL (ref 4.0–10.5)
nRBC: 0 % (ref 0.0–0.2)

## 2020-04-09 LAB — LACTIC ACID, PLASMA: Lactic Acid, Venous: 1.2 mmol/L (ref 0.5–1.9)

## 2020-04-09 NOTE — ED Triage Notes (Addendum)
Patient BIBA from home c/o bilateral lower leg edema and wounds on both feet. Patient states this has happened before and he was treated with IV antibiotics. He reports they are due to his work boots.   Denies any medical history. Patient is an alcoholic.   BP 160/90 P 99 RR 18 97% RA CBG 126

## 2020-04-10 ENCOUNTER — Emergency Department (HOSPITAL_COMMUNITY)
Admission: EM | Admit: 2020-04-10 | Discharge: 2020-04-10 | Disposition: A | Discharge status: Home / Self Care | Payer: 59 | Attending: Emergency Medicine | Admitting: Emergency Medicine

## 2020-04-10 DIAGNOSIS — L03119 Cellulitis of unspecified part of limb: Secondary | ICD-10-CM

## 2020-04-10 LAB — SARS CORONAVIRUS 2 (TAT 6-24 HRS): SARS Coronavirus 2: NEGATIVE

## 2020-04-10 MED ORDER — FLUCONAZOLE 100 MG PO TABS
100.0000 mg | ORAL_TABLET | Freq: Once | ORAL | Status: AC
  Administered 2020-04-10: 100 mg via ORAL
  Filled 2020-04-10: qty 1

## 2020-04-10 MED ORDER — CEPHALEXIN 500 MG PO CAPS
500.0000 mg | ORAL_CAPSULE | Freq: Once | ORAL | Status: AC
  Administered 2020-04-10: 500 mg via ORAL
  Filled 2020-04-10: qty 1

## 2020-04-10 MED ORDER — FLUCONAZOLE 200 MG PO TABS: 200.0000 mg | ORAL_TABLET | Freq: Two times a day (BID) | ORAL | 0 refills | Status: AC

## 2020-04-10 MED ORDER — CEPHALEXIN 500 MG PO CAPS: 500.0000 mg | ORAL_CAPSULE | Freq: Four times a day (QID) | ORAL | 0 refills | Status: DC

## 2020-04-10 MED ORDER — SODIUM CHLORIDE 0.9 % IV SOLN
1.0000 g | Freq: Once | INTRAVENOUS | Status: AC
  Administered 2020-04-10: 1 g via INTRAVENOUS
  Filled 2020-04-10: qty 10
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
## 2020-04-10 NOTE — Progress Notes (Addendum)
TOC CM attempted call to pt to assist with arranging appt with PCP and medication assistance. Number listed for pt does not work for patient. Attempted call to sister. States she does not have number for pt but if he calls will give number to him. ED provider updated. Pt does have insurance for meds, but no PCP listed.  [name] Finner RN CCM, [facility] ED TOC CM [phone]  04/10/2020 537 pm TOC CM received call from pt and provided him with contact number for [facility]. And states he can use his insurance to pay for medications. [place], [place] ED TOC CM [phone]

## 2020-04-10 NOTE — Discharge Instructions (Addendum)
Begin taking Keflex and Diflucan as prescribed.  Return to the emergency department for severe pain, difficulty breathing, or other new and concerning symptoms.  Social services should get in touch with you tomorrow regarding assistance with your medications.

## 2020-04-21 ENCOUNTER — Encounter (HOSPITAL_COMMUNITY): Payer: Self-pay

## 2020-04-21 ENCOUNTER — Other Ambulatory Visit: Payer: Self-pay

## 2020-04-21 ENCOUNTER — Emergency Department (HOSPITAL_COMMUNITY)
Admission: EM | Admit: 2020-04-21 | Discharge: 2020-04-22 | Disposition: A | Discharge status: Home / Self Care | Payer: 59 | Attending: Emergency Medicine | Admitting: Emergency Medicine

## 2020-04-21 DIAGNOSIS — L03116 Cellulitis of left lower limb: Secondary | ICD-10-CM | POA: Insufficient documentation

## 2020-04-21 DIAGNOSIS — F17200 Nicotine dependence, unspecified, uncomplicated: Secondary | ICD-10-CM | POA: Diagnosis not present

## 2020-04-21 DIAGNOSIS — R600 Localized edema: Secondary | ICD-10-CM | POA: Diagnosis present

## 2020-04-21 DIAGNOSIS — L03115 Cellulitis of right lower limb: Secondary | ICD-10-CM

## 2020-04-21 NOTE — ED Triage Notes (Signed)
Pt c/o bilateral feet swelling and weeping from cellulitis on feet.

## 2020-04-22 LAB — CBC
HCT: 40.5 % (ref 39.0–52.0)
Hemoglobin: 14 g/dL (ref 13.0–17.0)
MCH: 28.2 pg (ref 26.0–34.0)
MCHC: 34.6 g/dL (ref 30.0–36.0)
MCV: 81.7 fL (ref 80.0–100.0)
Platelets: 187 10*3/uL (ref 150–400)
RBC: 4.96 MIL/uL (ref 4.22–5.81)
RDW: 16.9 % — ABNORMAL HIGH (ref 11.5–15.5)
WBC: 5.2 10*3/uL (ref 4.0–10.5)
nRBC: 0 % (ref 0.0–0.2)

## 2020-04-22 LAB — BASIC METABOLIC PANEL
Anion gap: 12 (ref 5–15)
BUN: 7 mg/dL (ref 6–20)
CO2: 27 mmol/L (ref 22–32)
Calcium: 8.1 mg/dL — ABNORMAL LOW (ref 8.9–10.3)
Chloride: 102 mmol/L (ref 98–111)
Creatinine, Ser: 1.31 mg/dL — ABNORMAL HIGH (ref 0.61–1.24)
GFR, Estimated: >60 mL/min (ref >60)
Glucose, Bld: 106 mg/dL — ABNORMAL HIGH (ref 70–99)
Potassium: 4 mmol/L (ref 3.5–5.1)
Sodium: 141 mmol/L (ref 135–145)

## 2020-04-22 MED ORDER — SODIUM CHLORIDE 0.9 % IV SOLN
1.0000 g | Freq: Once | INTRAVENOUS | Status: AC
  Administered 2020-04-22: 1 g via INTRAVENOUS
  Filled 2020-04-22: qty 10

## 2020-04-22 MED ORDER — CEPHALEXIN 500 MG PO CAPS: 500.0000 mg | ORAL_CAPSULE | Freq: Three times a day (TID) | ORAL | 0 refills | Status: AC

## 2020-04-22 MED ORDER — FLUCONAZOLE 100 MG PO TABS: 100.0000 mg | ORAL_TABLET | Freq: Every day | ORAL | 0 refills | Status: DC
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
## 2020-04-22 NOTE — Discharge Instructions (Signed)
You have been placed back on antibiotics for management of your symptoms.  Take as prescribed until finished.  It is important to use the antifungal in addition to the antibiotic.  Try and keep your feet dry.  If you need to place them back in socks or boots, use over-the-counter athlete's foot powder to prevent symptomatic worsening.  Follow-up with a primary care doctor.  You may return for new or concerning symptoms.

## 2020-05-10 ENCOUNTER — Other Ambulatory Visit: Payer: Self-pay

## 2020-05-10 ENCOUNTER — Emergency Department (HOSPITAL_COMMUNITY)
Admission: EM | Admit: 2020-05-10 | Discharge: 2020-05-11 | Disposition: A | Discharge status: Home / Self Care | Payer: Self-pay | Attending: Emergency Medicine | Admitting: Emergency Medicine

## 2020-05-10 ENCOUNTER — Encounter (HOSPITAL_COMMUNITY): Payer: Self-pay

## 2020-05-10 DIAGNOSIS — M79672 Pain in left foot: Secondary | ICD-10-CM | POA: Insufficient documentation

## 2020-05-10 DIAGNOSIS — F17200 Nicotine dependence, unspecified, uncomplicated: Secondary | ICD-10-CM | POA: Insufficient documentation

## 2020-05-10 DIAGNOSIS — Y908 Blood alcohol level of 240 mg/100 ml or more: Secondary | ICD-10-CM | POA: Insufficient documentation

## 2020-05-10 DIAGNOSIS — F10220 Alcohol dependence with intoxication, uncomplicated: Secondary | ICD-10-CM | POA: Insufficient documentation

## 2020-05-10 DIAGNOSIS — B353 Tinea pedis: Secondary | ICD-10-CM | POA: Insufficient documentation

## 2020-05-10 DIAGNOSIS — F10920 Alcohol use, unspecified with intoxication, uncomplicated: Secondary | ICD-10-CM

## 2020-05-10 NOTE — ED Triage Notes (Signed)
Pt from home. Swelling, redness, and pain in bilateral feet. Recently diagnosed with cellulitis. Pt states he finished all of his abx that were prescribed. ETOH on board.

## 2020-05-11 ENCOUNTER — Emergency Department (HOSPITAL_COMMUNITY): Payer: Self-pay

## 2020-05-11 LAB — CBC WITH DIFFERENTIAL/PLATELET
Abs Immature Granulocytes: 0 10*3/uL (ref 0.00–0.07)
Basophils Absolute: 0 10*3/uL (ref 0.0–0.1)
Basophils Relative: 1 %
Eosinophils Absolute: 0.8 10*3/uL — ABNORMAL HIGH (ref 0.0–0.5)
Eosinophils Relative: 13 %
HCT: 41.1 % (ref 39.0–52.0)
Hemoglobin: 14.1 g/dL (ref 13.0–17.0)
Immature Granulocytes: 0 %
Lymphocytes Relative: 27 %
Lymphs Abs: 1.8 10*3/uL (ref 0.7–4.0)
MCH: 28.3 pg (ref 26.0–34.0)
MCHC: 34.3 g/dL (ref 30.0–36.0)
MCV: 82.4 fL (ref 80.0–100.0)
Monocytes Absolute: 0.6 10*3/uL (ref 0.1–1.0)
Monocytes Relative: 9 %
Neutro Abs: 3.3 10*3/uL (ref 1.7–7.7)
Neutrophils Relative %: 50 %
Platelets: 192 10*3/uL (ref 150–400)
RBC: 4.99 MIL/uL (ref 4.22–5.81)
RDW: 15.8 % — ABNORMAL HIGH (ref 11.5–15.5)
WBC: 6.5 10*3/uL (ref 4.0–10.5)
nRBC: 0 % (ref 0.0–0.2)

## 2020-05-11 LAB — BASIC METABOLIC PANEL
Anion gap: 12 (ref 5–15)
BUN: 9 mg/dL (ref 6–20)
CO2: 24 mmol/L (ref 22–32)
Calcium: 8.3 mg/dL — ABNORMAL LOW (ref 8.9–10.3)
Chloride: 102 mmol/L (ref 98–111)
Creatinine, Ser: 1.45 mg/dL — ABNORMAL HIGH (ref 0.61–1.24)
GFR, Estimated: >60 mL/min (ref >60)
Glucose, Bld: 99 mg/dL (ref 70–99)
Potassium: 3.9 mmol/L (ref 3.5–5.1)
Sodium: 138 mmol/L (ref 135–145)

## 2020-05-11 LAB — ETHANOL: Alcohol, Ethyl (B): 377 mg/dL (ref <10)

## 2020-05-11 MED ORDER — FLUCONAZOLE 200 MG PO TABS: 200.0000 mg | ORAL_TABLET | Freq: Every day | ORAL | 0 refills | Status: AC

## 2020-05-11 MED ORDER — BARRIER CREAM NON-SPECIFIED: 1.0000 "application " | TOPICAL_CREAM | Freq: Two times a day (BID) | TOPICAL | Status: DC | PRN

## 2020-05-11 MED ORDER — FLUCONAZOLE 100 MG PO TABS
100.0000 mg | ORAL_TABLET | Freq: Once | ORAL | Status: DC
  Filled 2020-05-11: qty 1

## 2020-05-11 MED ORDER — FLUCONAZOLE 150 MG PO TABS
150.0000 mg | ORAL_TABLET | Freq: Once | ORAL | Status: AC
  Administered 2020-05-11: 150 mg via ORAL
  Filled 2020-05-11: qty 1

## 2020-05-11 MED ORDER — FLUCONAZOLE 150 MG PO TABS: 150.0000 mg | ORAL_TABLET | ORAL | 0 refills | Status: AC

## 2020-05-11 MED ORDER — ZINC OXIDE 12.8 % EX OINT
TOPICAL_OINTMENT | Freq: Two times a day (BID) | CUTANEOUS | Status: DC | PRN
  Filled 2020-05-11: qty 56.7

## 2020-05-11 NOTE — ED Notes (Signed)
Basin with water and washcloths provided to pt to clean his feet. Wound cleanser spray sat next to the bed for pt to apply to feet once he is done cleaning them.

## 2020-05-11 NOTE — Discharge Instructions (Signed)
You were seen today for bilateral foot pain and swelling.  Your exam is consistent with extensive tinea pedis or a fungal infection of the feet.  It is very important that you keep your feet clean and dry.  You can use a barrier cream.  You need to take the antibiotic once a week for the next 4 to 6 weeks.  Podiatry follow-up was provided.
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
## 2020-05-11 NOTE — ED Notes (Signed)
Pt to X-Ray via stretcher 

## 2020-05-11 NOTE — ED Notes (Signed)
Cream from pharmacy given to pt

## 2021-04-10 ENCOUNTER — Emergency Department (HOSPITAL_COMMUNITY): Payer: Self-pay

## 2021-04-10 ENCOUNTER — Encounter (HOSPITAL_COMMUNITY): Payer: Self-pay

## 2021-04-10 ENCOUNTER — Emergency Department (HOSPITAL_COMMUNITY)
Admission: EM | Admit: 2021-04-10 | Discharge: 2021-04-10 | Disposition: A | Discharge status: Home / Self Care | Payer: Self-pay | Attending: Student | Admitting: Student

## 2021-04-10 ENCOUNTER — Other Ambulatory Visit: Payer: Self-pay

## 2021-04-10 DIAGNOSIS — Z79899 Other long term (current) drug therapy: Secondary | ICD-10-CM | POA: Insufficient documentation

## 2021-04-10 DIAGNOSIS — R079 Chest pain, unspecified: Secondary | ICD-10-CM | POA: Insufficient documentation

## 2021-04-10 DIAGNOSIS — J40 Bronchitis, not specified as acute or chronic: Secondary | ICD-10-CM | POA: Insufficient documentation

## 2021-04-10 DIAGNOSIS — F17210 Nicotine dependence, cigarettes, uncomplicated: Secondary | ICD-10-CM | POA: Insufficient documentation

## 2021-04-10 DIAGNOSIS — Z20822 Contact with and (suspected) exposure to covid-19: Secondary | ICD-10-CM | POA: Insufficient documentation

## 2021-04-10 HISTORY — DX: Acute pancreatitis without necrosis or infection, unspecified: K85.90

## 2021-04-10 LAB — COMPREHENSIVE METABOLIC PANEL
ALT: 31 U/L (ref 0–44)
AST: 41 U/L (ref 15–41)
Albumin: 3.7 g/dL (ref 3.5–5.0)
Alkaline Phosphatase: 83 U/L (ref 38–126)
Anion gap: 8 (ref 5–15)
BUN: 7 mg/dL (ref 6–20)
CO2: 27 mmol/L (ref 22–32)
Calcium: 8.6 mg/dL — ABNORMAL LOW (ref 8.9–10.3)
Chloride: 100 mmol/L (ref 98–111)
Creatinine, Ser: 1.39 mg/dL — ABNORMAL HIGH (ref 0.61–1.24)
GFR, Estimated: >60 mL/min (ref >60)
Glucose, Bld: 91 mg/dL (ref 70–99)
Potassium: 3.7 mmol/L (ref 3.5–5.1)
Sodium: 135 mmol/L (ref 135–145)
Total Bilirubin: 1.3 mg/dL — ABNORMAL HIGH (ref 0.3–1.2)
Total Protein: 7.2 g/dL (ref 6.5–8.1)

## 2021-04-10 LAB — LIPASE, BLOOD: Lipase: 41 U/L (ref 11–51)

## 2021-04-10 LAB — CBC WITH DIFFERENTIAL/PLATELET
Abs Immature Granulocytes: 0.01 10*3/uL (ref 0.00–0.07)
Basophils Absolute: 0 10*3/uL (ref 0.0–0.1)
Basophils Relative: 1 %
Eosinophils Absolute: 0.1 10*3/uL (ref 0.0–0.5)
Eosinophils Relative: 1 %
HCT: 39.7 % (ref 39.0–52.0)
Hemoglobin: 13.6 g/dL (ref 13.0–17.0)
Immature Granulocytes: 0 %
Lymphocytes Relative: 29 %
Lymphs Abs: 1.1 10*3/uL (ref 0.7–4.0)
MCH: 28.2 pg (ref 26.0–34.0)
MCHC: 34.3 g/dL (ref 30.0–36.0)
MCV: 82.2 fL (ref 80.0–100.0)
Monocytes Absolute: 0.4 10*3/uL (ref 0.1–1.0)
Monocytes Relative: 9 %
Neutro Abs: 2.3 10*3/uL (ref 1.7–7.7)
Neutrophils Relative %: 60 %
Platelets: 151 10*3/uL (ref 150–400)
RBC: 4.83 MIL/uL (ref 4.22–5.81)
RDW: 15.1 % (ref 11.5–15.5)
WBC: 3.8 10*3/uL — ABNORMAL LOW (ref 4.0–10.5)
nRBC: 0 % (ref 0.0–0.2)

## 2021-04-10 LAB — RESP PANEL BY RT-PCR (FLU A&B, COVID) ARPGX2
Influenza A by PCR: NEGATIVE
Influenza B by PCR: NEGATIVE
SARS Coronavirus 2 by RT PCR: NEGATIVE

## 2021-04-10 MED ORDER — PANTOPRAZOLE SODIUM 40 MG IV SOLR
40.0000 mg | Freq: Once | INTRAVENOUS | Status: AC
  Administered 2021-04-10: 18:00:00 40 mg via INTRAVENOUS
  Filled 2021-04-10: qty 40

## 2021-04-10 MED ORDER — LIDOCAINE VISCOUS HCL 2 % MT SOLN
15.0000 mL | Freq: Once | OROMUCOSAL | Status: AC
  Administered 2021-04-10: 18:00:00 15 mL via ORAL
  Filled 2021-04-10: qty 15

## 2021-04-10 MED ORDER — ALUM & MAG HYDROXIDE-SIMETH 200-200-20 MG/5ML PO SUSP
30.0000 mL | Freq: Once | ORAL | Status: AC
Start: 2021-04-10 — End: 2021-04-10
  Administered 2021-04-10: 18:00:00 30 mL via ORAL
  Filled 2021-04-10: qty 30

## 2021-04-10 MED ORDER — BENZONATATE 100 MG PO CAPS
100.0000 mg | ORAL_CAPSULE | Freq: Three times a day (TID) | ORAL | 0 refills | Status: AC
Start: 2021-04-10 — End: ?
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
## 2021-04-10 NOTE — ED Triage Notes (Signed)
Patient reports that he has had  intermittent chest pain, vomiting a small amount of dark blood in his emesis. Patient also reports that he has blood in his phlegm when he coughs and states it is bright red.  Patient also reports that he is having abdominal pain and states a history of pancreatitis.  Very hard to get a clear picture of symptoms.
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
## 2021-04-11 LAB — TYPE AND SCREEN
ABO/RH(D): AB POS
Antibody Screen: NEGATIVE

## 2021-11-26 IMAGING — CR DG FOOT COMPLETE 3+V*R*
3 series · 3 of 3 positions shown · non-contrast
Comparison: None.

CLINICAL DATA: Red and swollen feet.

EXAM:
RIGHT FOOT COMPLETE - 3+ VIEW

[x foot ap right (1 of 2)]
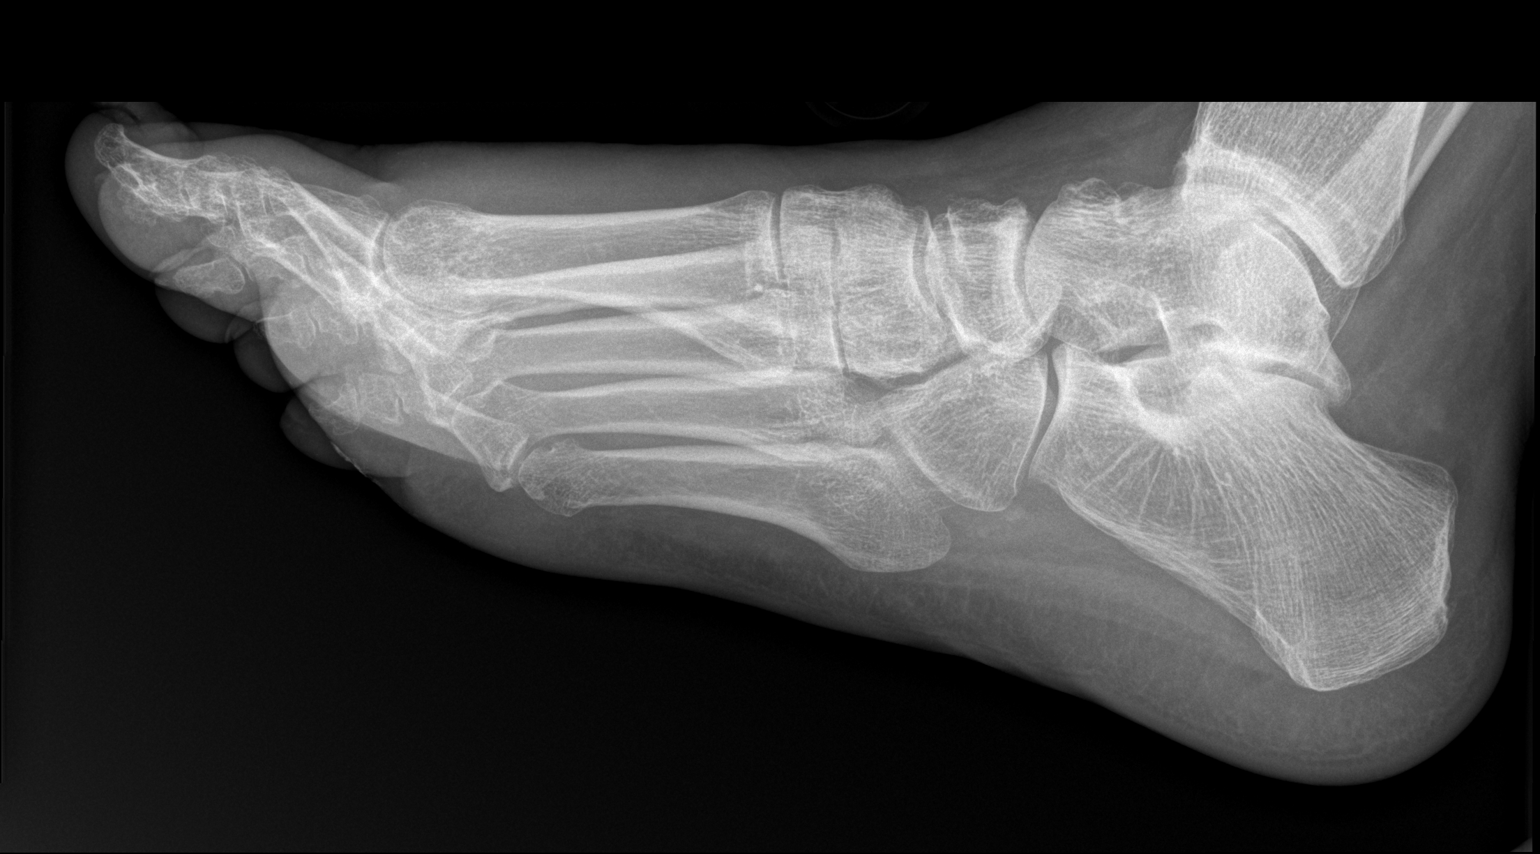

[x foot ap right (2 of 2)]
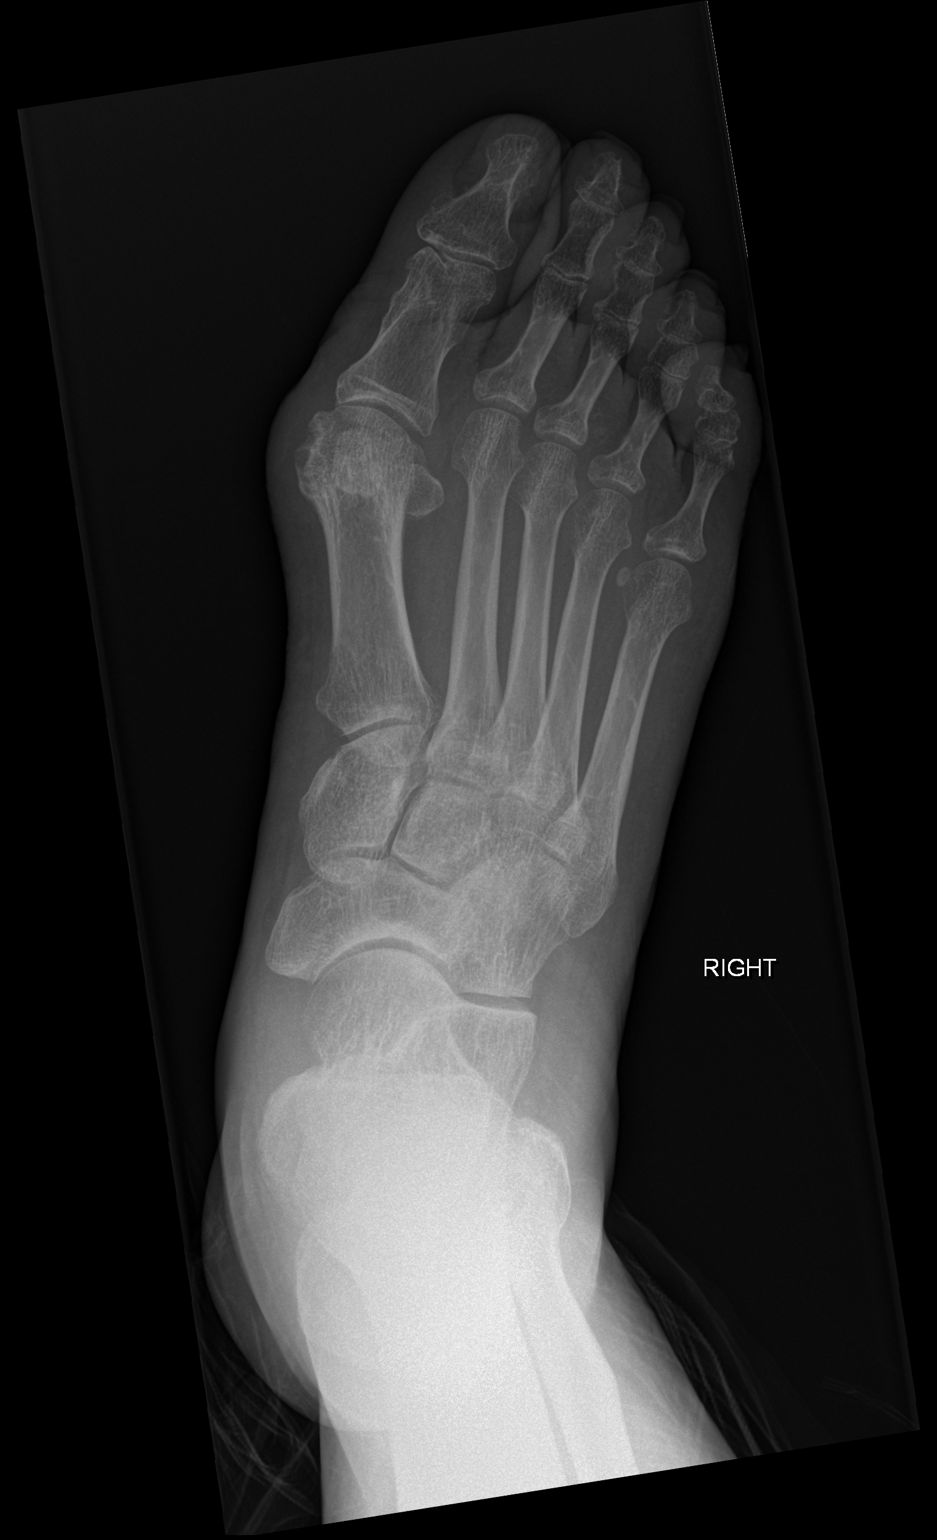

[x foot obl right]
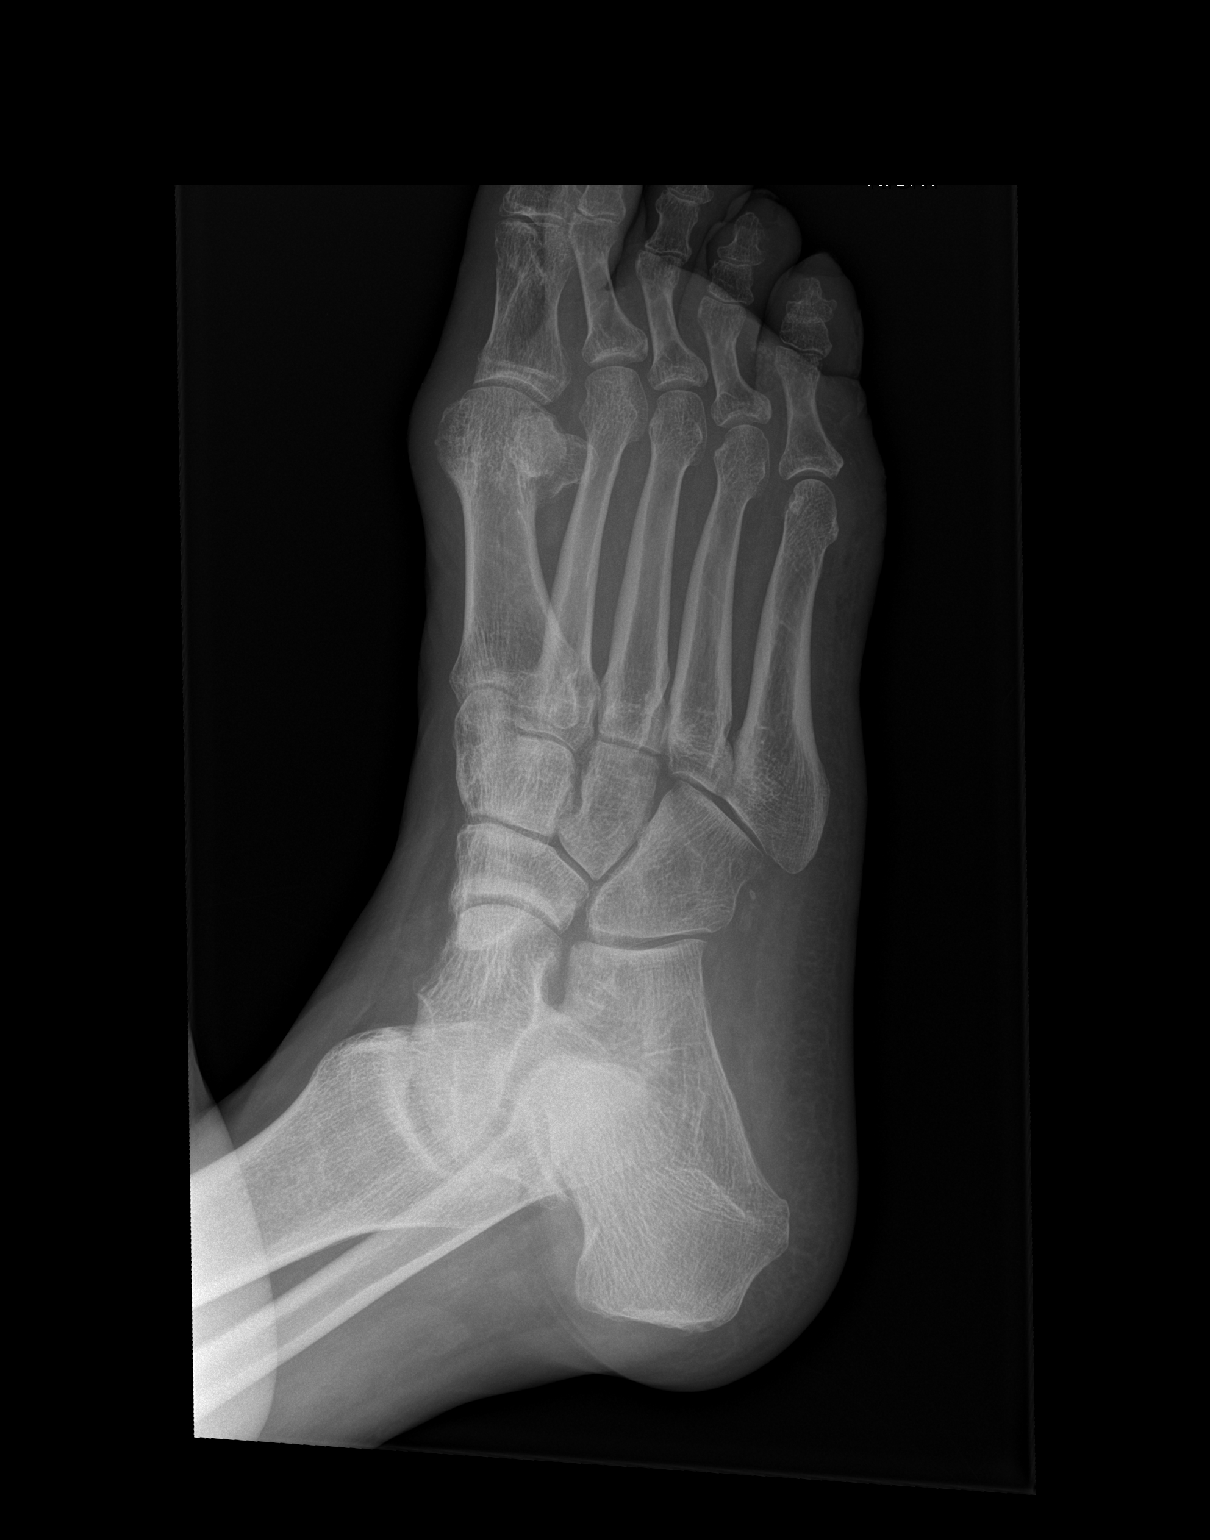

[3 of 3 positions shown; findings below may reference images not displayed]

FINDINGS: There is no evidence of an acute fracture or dislocation. A chronic
deformity is seen involving the proximal phalanx of the right great
toe. Mild diffuse soft tissue swelling is seen.
IMPRESSION: Mild diffuse soft tissue swelling without evidence of acute osseous
abnormality.
